# Patient Record
Sex: Male | Born: 1975 | Race: White | Hispanic: No | Marital: Single
Health system: Southern US, Community
[De-identification: ages and names within clinical notes are randomized; demographics above are authoritative.]

## PROBLEM LIST (undated history)

## (undated) DIAGNOSIS — Z9889 Other specified postprocedural states: Secondary | ICD-10-CM

---

## 2001-03-23 ENCOUNTER — Emergency Department (HOSPITAL_COMMUNITY): Admission: EM | Admit: 2001-03-23 | Discharge: 2001-03-23 | Payer: Self-pay | Admitting: Emergency Medicine

## 2009-09-01 ENCOUNTER — Emergency Department: Payer: Self-pay | Admitting: Emergency Medicine

## 2009-09-04 ENCOUNTER — Emergency Department: Payer: Self-pay | Admitting: Emergency Medicine

## 2011-09-20 ENCOUNTER — Emergency Department: Payer: Self-pay | Admitting: Unknown Physician Specialty

## 2013-07-05 ENCOUNTER — Emergency Department: Payer: Self-pay | Admitting: Emergency Medicine

## 2013-08-25 ENCOUNTER — Emergency Department: Payer: Self-pay | Admitting: Emergency Medicine

## 2014-08-25 ENCOUNTER — Emergency Department: Payer: Self-pay | Admitting: Emergency Medicine

## 2014-11-11 ENCOUNTER — Emergency Department: Payer: Self-pay | Admitting: Emergency Medicine

## 2015-12-18 ENCOUNTER — Other Ambulatory Visit: Payer: Self-pay | Admitting: Physical Medicine and Rehabilitation

## 2015-12-18 DIAGNOSIS — M5416 Radiculopathy, lumbar region: Secondary | ICD-10-CM

## 2015-12-27 ENCOUNTER — Ambulatory Visit: Admission: RE | Admit: 2015-12-27 | Payer: Medicaid Other | Source: Ambulatory Visit

## 2018-01-04 ENCOUNTER — Emergency Department
Admission: EM | Admit: 2018-01-04 | Discharge: 2018-01-04 | Disposition: A | Discharge status: Home / Self Care | Payer: Self-pay | Attending: Emergency Medicine | Admitting: Emergency Medicine

## 2018-01-04 ENCOUNTER — Encounter: Payer: Self-pay | Admitting: Emergency Medicine

## 2018-01-04 ENCOUNTER — Emergency Department: Payer: Self-pay

## 2018-01-04 DIAGNOSIS — W172XXA Fall into hole, initial encounter: Secondary | ICD-10-CM | POA: Insufficient documentation

## 2018-01-04 DIAGNOSIS — Y929 Unspecified place or not applicable: Secondary | ICD-10-CM | POA: Insufficient documentation

## 2018-01-04 DIAGNOSIS — Y999 Unspecified external cause status: Secondary | ICD-10-CM | POA: Insufficient documentation

## 2018-01-04 DIAGNOSIS — S83402A Sprain of unspecified collateral ligament of left knee, initial encounter: Secondary | ICD-10-CM | POA: Insufficient documentation

## 2018-01-04 DIAGNOSIS — Y939 Activity, unspecified: Secondary | ICD-10-CM | POA: Insufficient documentation

## 2018-01-04 MED ORDER — NAPROXEN 500 MG PO TABS: 500.0000 mg | ORAL_TABLET | Freq: Two times a day (BID) | ORAL | Status: DC

## 2018-01-04 MED ORDER — TRAMADOL HCL 50 MG PO TABS: 50.0000 mg | ORAL_TABLET | Freq: Four times a day (QID) | ORAL | 0 refills | Status: DC | PRN

## 2018-01-04 NOTE — ED Notes (Signed)
Patient ambulatory to lobby. Verbalized understanding of discharge instructions and follow-up care.

## 2018-01-04 NOTE — ED Triage Notes (Signed)
Pt reports "falling in a hole" four days ago and then states "my boot got caught and my knee twisted." Pt reports an outward rotation at time of injury. Pt states "i've just been trying to dodge this part and hope it gets better." No deformity or swelling visualized to left knee. Pt has been using OTC brace and medication.

## 2018-01-04 NOTE — ED Provider Notes (Signed)
[facility] Emergency Department Provider Note   ____________________________________________   First MD Initiated Contact with Patient 01/04/18 1555     (approximate)  I have reviewed the triage vital signs and the nursing notes.   HISTORY  Chief Complaint Knee Pain    HPI Scott Case is a 42 y.o. male patient complaining of left knee pain secondary to fall in a hole 4 days ago.  Patient rates the pain as 8/10.  Patient described the pain is "aching".  Patient is using over-the-counter elastic knee support.  Patient is able to bear weight with difficulty.  History reviewed. No pertinent past medical history.  There are no active problems to display for this patient.   History reviewed. No pertinent surgical history.  Prior to Admission medications   Medication Sig Start Date End Date Taking? Authorizing Provider  naproxen (NAPROSYN) 500 MG tablet Take 1 tablet (500 mg total) by mouth 2 (two) times daily with a meal. 01/04/18   Joni Reining, PA-C  traMADol (ULTRAM) 50 MG tablet Take 1 tablet (50 mg total) by mouth every 6 (six) hours as needed for moderate pain. 01/04/18   Joni Reining, PA-C    Allergies Patient has no allergy information on record.  No family history on file.  Social History Social History   Tobacco Use  . Smoking status: Former Games developer  . Smokeless tobacco: Never Used  Substance Use Topics  . Alcohol use: No    Frequency: Never  . Drug use: No    Review of Systems Constitutional: No fever/chills Eyes: No visual changes. ENT: No sore throat. Cardiovascular: Denies chest pain. Respiratory: Denies shortness of breath. Gastrointestinal: No abdominal pain.  No nausea, no vomiting.  No diarrhea.  No constipation. Genitourinary: Negative for dysuria. Musculoskeletal: Left knee pain. Skin: Negative for rash. Neurological: Negative for headaches, focal weakness or  numbness.   ____________________________________________   PHYSICAL EXAM:  VITAL SIGNS: ED Triage Vitals  Enc Vitals Group     BP 01/04/18 1521 (!) 113/40     Pulse Rate 01/04/18 1521 68     Resp 01/04/18 1521 18     Temp 01/04/18 1521 97.8 F (36.6 C)     Temp Source 01/04/18 1521 Oral     SpO2 01/04/18 1521 98 %     Weight 01/04/18 1524 185 lb (83.9 kg)     Height 01/04/18 1524 6\' 1"  (1.854 m)     Head Circumference --      Peak Flow --      Pain Score 01/04/18 1521 8     Pain Loc --      Pain Edu? --      Excl. in GC? --    Constitutional: Alert and oriented. Well appearing and in no acute distress. Cardiovascular: Normal rate, regular rhythm. Grossly normal heart sounds.  Good peripheral circulation. Respiratory: Normal respiratory effort.  No retractions. Lungs CTAB. Musculoskeletal: No obvious deformity or edema to the left knee.  Decreased range of motion with flexion limited by complaint of pain.  Positive patellar crepitus with palpation.  Positive guarding insertion point of the LCL. Neurologic:  Normal speech and language. No gross focal neurologic deficits are appreciated. No gait instability. Skin:  Skin is warm, dry and intact. No rash noted. Psychiatric: Mood and affect are normal. Speech and behavior are normal.  ____________________________________________   LABS (all labs ordered are listed, but only abnormal results are displayed)  Labs Reviewed - No data  to display ____________________________________________  EKG   ____________________________________________  RADIOLOGY  ED MD interpretation: No acute findings on x-ray of the left knee.  Official radiology report(s): Dg Knee Complete 4 Views Left  Result Date: 01/04/2018 CLINICAL DATA:  Pain to medial knee after twisting knee 4 days ago. EXAM: LEFT KNEE - COMPLETE 4+ VIEW COMPARISON:  None. FINDINGS: Minimal degenerative changes in the mediolateral compartment with no fracture, dislocation,  or joint effusion. IMPRESSION: Minimal degenerative changes. Electronically Signed   By: Gerome Samavid  Williams III M.D   On: 01/04/2018 16:15    ____________________________________________   PROCEDURES  Procedure(s) performed: None  Procedures  Critical Care performed: No  ____________________________________________   INITIAL IMPRESSION / ASSESSMENT AND PLAN / ED COURSE  As part of my medical decision making, I reviewed the following data within the electronic MEDICAL RECORD NUMBER   Left knee pain secondary to sprain.  Discussed negative x-ray findings with patient.  Patient advised to continue wearing over-the-counter elastic knee support.  Patient given discharge care instruction prescription for naproxen and tramadol.  Patient given a work note for 2 days.  Patient advised [facility].       ____________________________________________   FINAL CLINICAL IMPRESSION(S) / ED DIAGNOSES  Final diagnoses:  Sprain of collateral ligament of left knee, initial encounter     ED Discharge Orders        Ordered    naproxen (NAPROSYN) 500 MG tablet  2 times daily with meals     01/04/18 1622    traMADol (ULTRAM) 50 MG tablet  Every 6 hours PRN     01/04/18 1622       Note:  This document was prepared using Dragon voice recognition software and may include unintentional dictation errors.    Joni ReiningSmith, Litha Lamartina K, PA-C 01/04/18 1626    Joni ReiningSmith, Emberlin Verner K, PA-C 01/04/18 1627    Merrily Brittleifenbark, Neil, MD 01/04/18 [phone]

## 2019-03-26 IMAGING — CR DG KNEE COMPLETE 4+V*L*
1 series · 4 of 4 positions shown · non-contrast
Comparison: None.

CLINICAL DATA: Pain to medial knee after twisting knee 4 days ago.

EXAM:
LEFT KNEE - COMPLETE 4+ VIEW

[Series 1: dg knee complete 4 views left · 0.14mm/px · 4 of 4 slices shown]
[im 1/4]
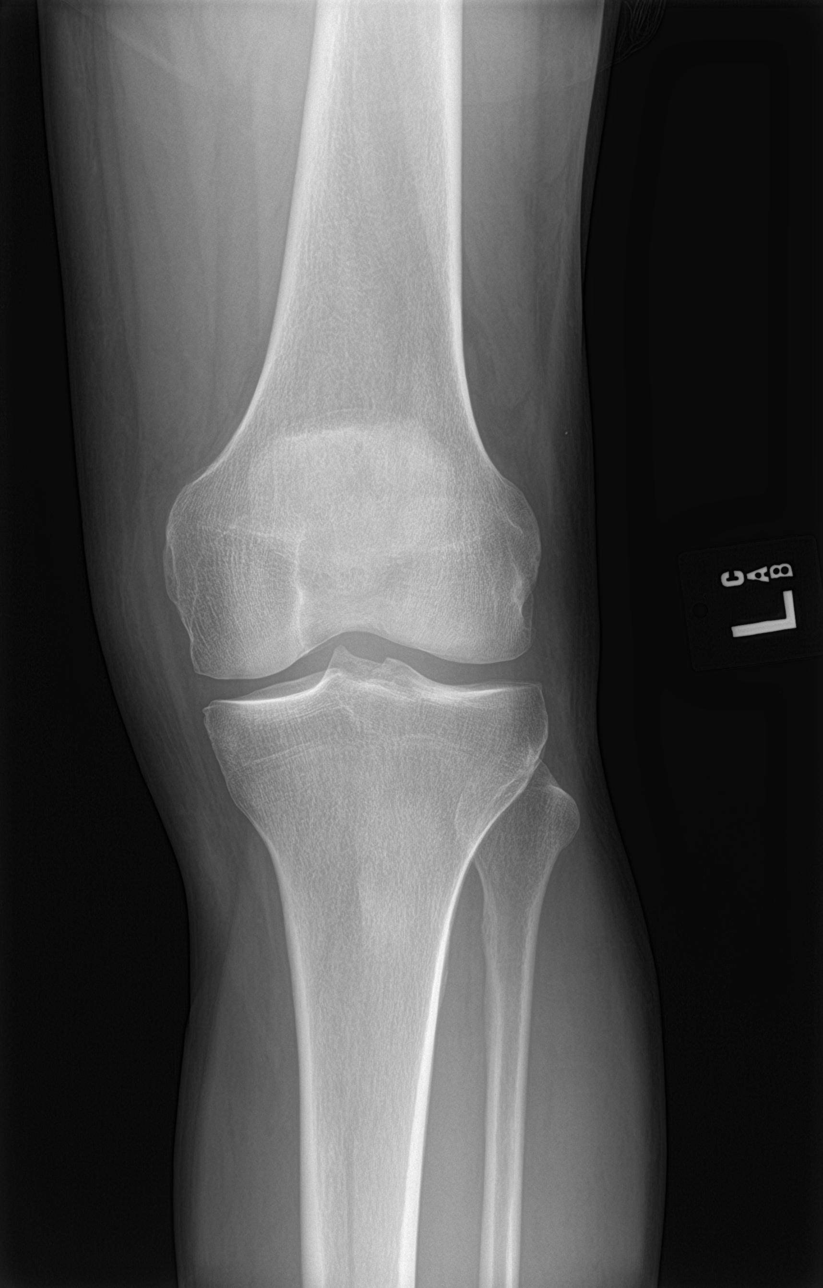
[im 2/4]
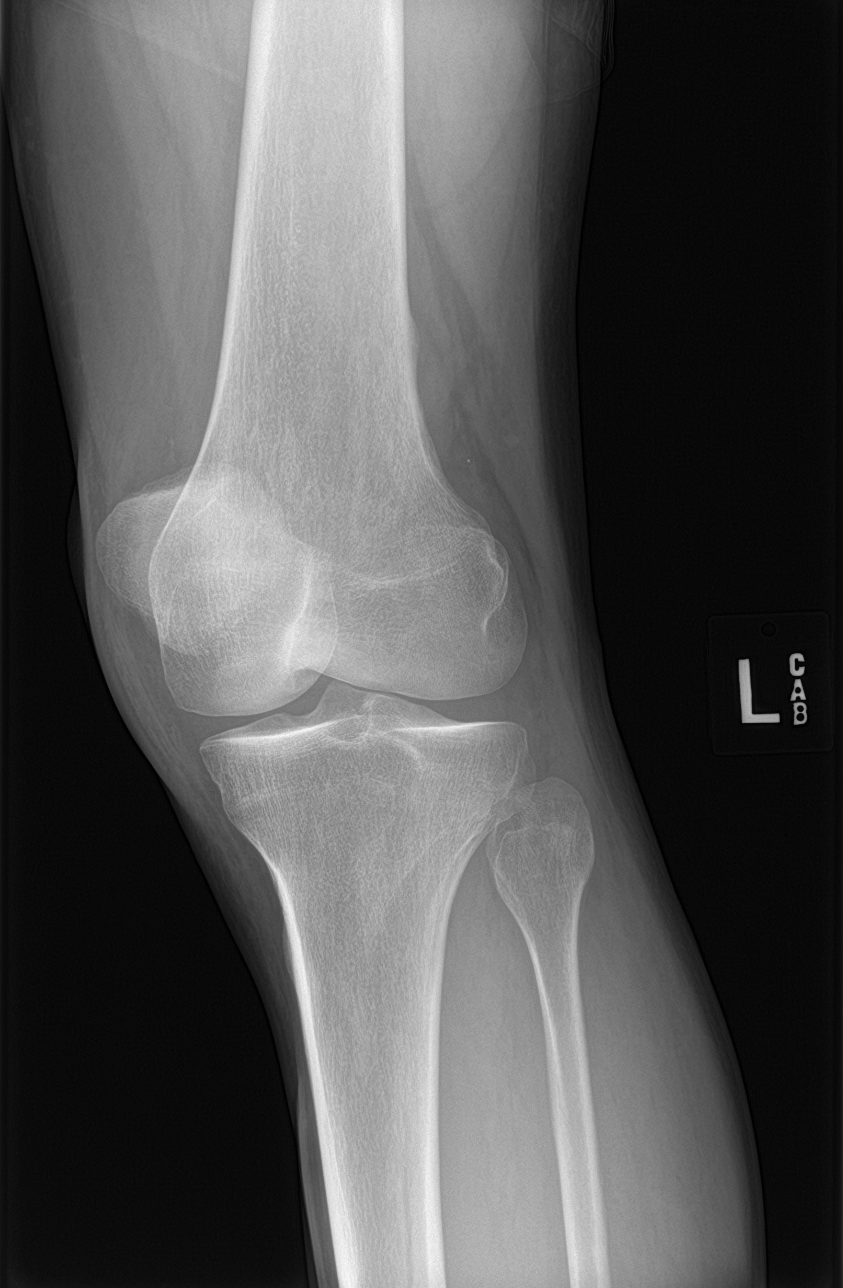
[im 3/4]
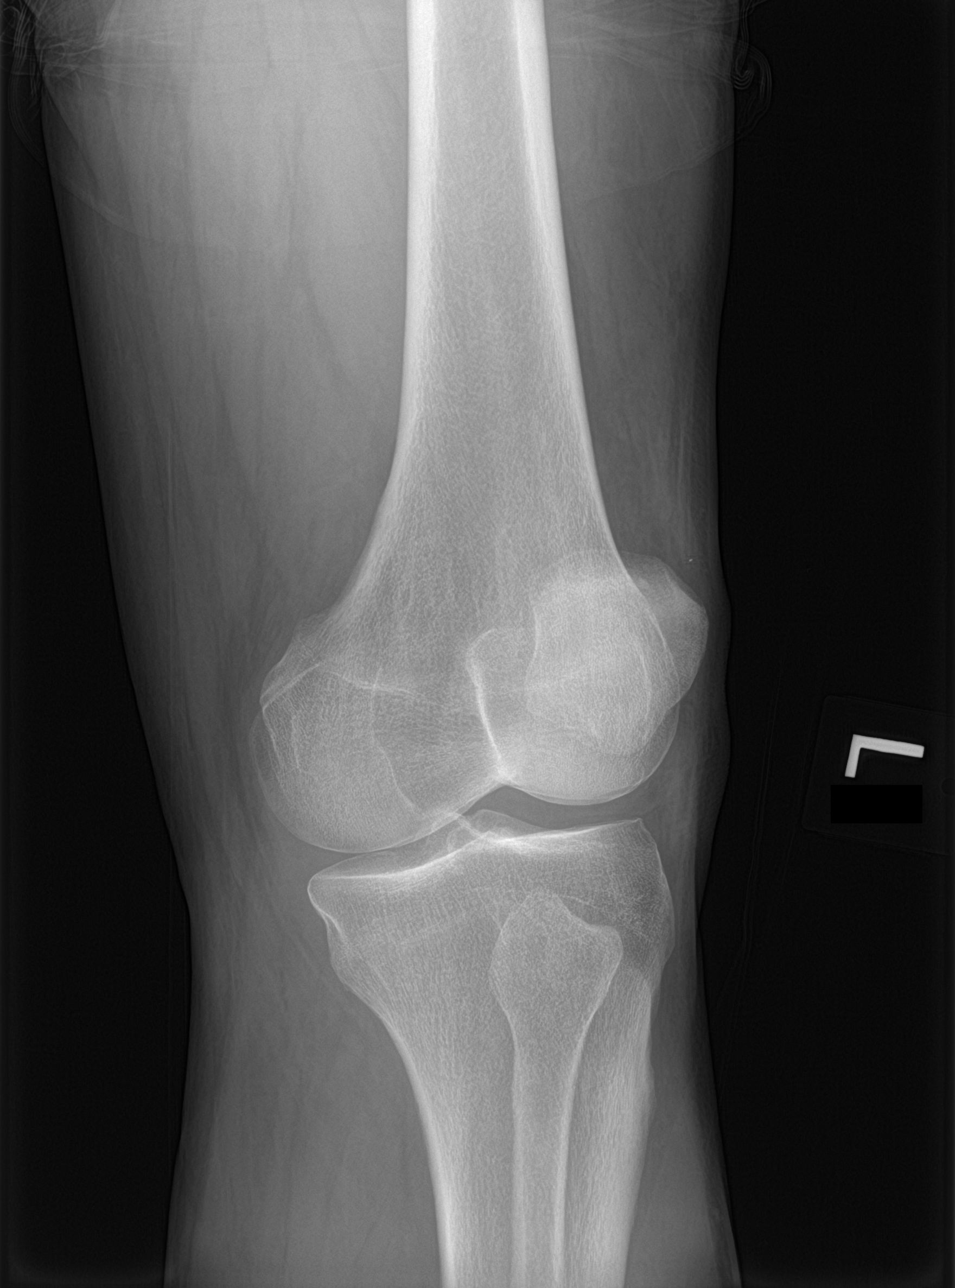
[im 4/4]
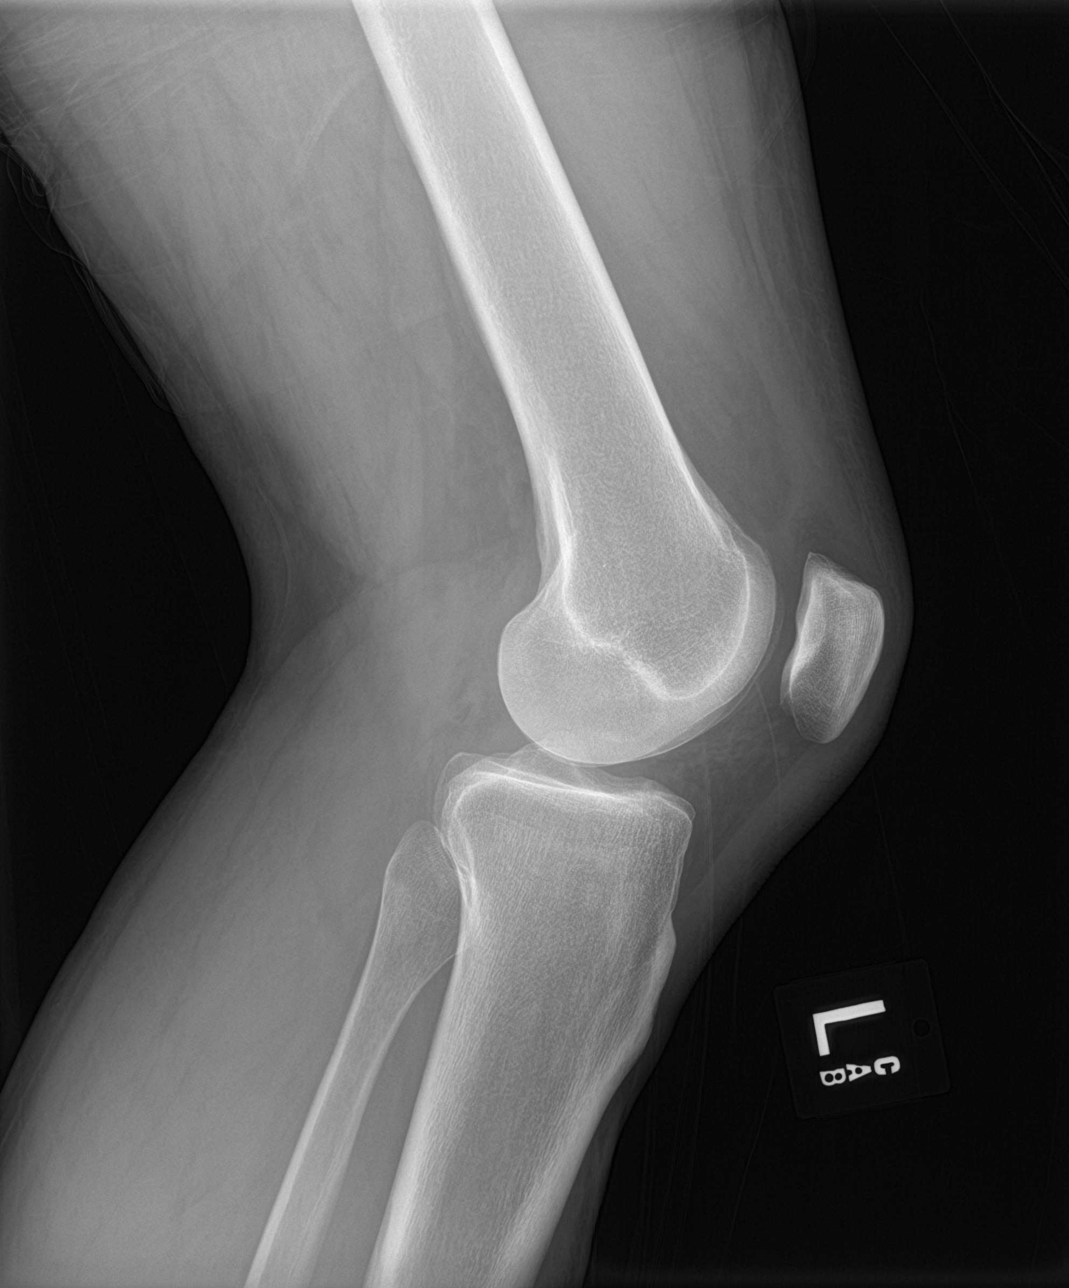

[4 of 4 positions shown; findings below may reference images not displayed]

FINDINGS: Minimal degenerative changes in the mediolateral compartment with no
fracture, dislocation, or joint effusion.
IMPRESSION: Minimal degenerative changes.

## 2020-08-12 ENCOUNTER — Emergency Department: Payer: Medicaid Other

## 2020-08-12 ENCOUNTER — Other Ambulatory Visit: Payer: Self-pay

## 2020-08-12 DIAGNOSIS — S61216A Laceration without foreign body of right little finger without damage to nail, initial encounter: Secondary | ICD-10-CM | POA: Insufficient documentation

## 2020-08-12 DIAGNOSIS — X58XXXA Exposure to other specified factors, initial encounter: Secondary | ICD-10-CM | POA: Insufficient documentation

## 2020-08-12 DIAGNOSIS — Z5321 Procedure and treatment not carried out due to patient leaving prior to being seen by health care provider: Secondary | ICD-10-CM | POA: Insufficient documentation

## 2020-08-12 MED ORDER — OXYCODONE HCL 5 MG PO TABS
5.0000 mg | ORAL_TABLET | Freq: Once | ORAL | Status: AC
  Administered 2020-08-12: 5 mg via ORAL
  Filled 2020-08-12: qty 1

## 2020-08-12 MED ORDER — ACETAMINOPHEN 500 MG PO TABS
1000.0000 mg | ORAL_TABLET | Freq: Once | ORAL | Status: AC
  Administered 2020-08-12: 1000 mg via ORAL
  Filled 2020-08-12: qty 2

## 2020-08-12 NOTE — ED Triage Notes (Signed)
Pt presents via POV c/o right pinky injury. Pt with extensive laceration of right pinky finger. Ambulatory to triage.

## 2020-08-13 ENCOUNTER — Emergency Department
Admission: EM | Admit: 2020-08-13 | Discharge: 2020-08-13 | Disposition: A | Discharge status: Home / Self Care | Payer: Medicaid Other | Attending: Emergency Medicine | Admitting: Emergency Medicine

## 2021-08-11 ENCOUNTER — Emergency Department (HOSPITAL_COMMUNITY): Payer: Self-pay

## 2021-08-11 ENCOUNTER — Encounter (HOSPITAL_COMMUNITY): Payer: Self-pay | Admitting: *Deleted

## 2021-08-11 ENCOUNTER — Inpatient Hospital Stay (HOSPITAL_COMMUNITY)
Admission: EM | Admit: 2021-08-11 | Discharge: 2021-08-15 | DRG: 200 | Disposition: A | Discharge status: Home / Self Care | Payer: Self-pay | Attending: Surgery | Admitting: Surgery

## 2021-08-11 ENCOUNTER — Observation Stay (HOSPITAL_COMMUNITY): Payer: Self-pay

## 2021-08-11 DIAGNOSIS — J939 Pneumothorax, unspecified: Secondary | ICD-10-CM

## 2021-08-11 DIAGNOSIS — J9690 Respiratory failure, unspecified, unspecified whether with hypoxia or hypercapnia: Secondary | ICD-10-CM

## 2021-08-11 DIAGNOSIS — Z9689 Presence of other specified functional implants: Secondary | ICD-10-CM

## 2021-08-11 DIAGNOSIS — S270XXA Traumatic pneumothorax, initial encounter: Secondary | ICD-10-CM

## 2021-08-11 DIAGNOSIS — Z4682 Encounter for fitting and adjustment of non-vascular catheter: Secondary | ICD-10-CM

## 2021-08-11 DIAGNOSIS — T1490XA Injury, unspecified, initial encounter: Principal | ICD-10-CM

## 2021-08-11 DIAGNOSIS — S2249XA Multiple fractures of ribs, unspecified side, initial encounter for closed fracture: Secondary | ICD-10-CM | POA: Diagnosis present

## 2021-08-11 DIAGNOSIS — T797XXA Traumatic subcutaneous emphysema, initial encounter: Secondary | ICD-10-CM | POA: Diagnosis present

## 2021-08-11 DIAGNOSIS — Z20822 Contact with and (suspected) exposure to covid-19: Secondary | ICD-10-CM | POA: Diagnosis present

## 2021-08-11 DIAGNOSIS — S2241XA Multiple fractures of ribs, right side, initial encounter for closed fracture: Secondary | ICD-10-CM | POA: Diagnosis present

## 2021-08-11 DIAGNOSIS — R030 Elevated blood-pressure reading, without diagnosis of hypertension: Secondary | ICD-10-CM | POA: Diagnosis present

## 2021-08-11 HISTORY — DX: Other specified postprocedural states: Z98.890

## 2021-08-11 LAB — COMPREHENSIVE METABOLIC PANEL
ALT: 43 U/L (ref 0–44)
AST: 42 U/L — ABNORMAL HIGH (ref 15–41)
Albumin: 4.2 g/dL (ref 3.5–5.0)
Alkaline Phosphatase: 63 U/L (ref 38–126)
Anion gap: 10 (ref 5–15)
BUN: 13 mg/dL (ref 6–20)
CO2: 25 mmol/L (ref 22–32)
Calcium: 9 mg/dL (ref 8.9–10.3)
Chloride: 101 mmol/L (ref 98–111)
Creatinine, Ser: 1.25 mg/dL — ABNORMAL HIGH (ref 0.61–1.24)
GFR, Estimated: >60 mL/min (ref >60)
Glucose, Bld: 111 mg/dL — ABNORMAL HIGH (ref 70–99)
Potassium: 3.5 mmol/L (ref 3.5–5.1)
Sodium: 136 mmol/L (ref 135–145)
Total Bilirubin: 1.5 mg/dL — ABNORMAL HIGH (ref 0.3–1.2)
Total Protein: 6.7 g/dL (ref 6.5–8.1)

## 2021-08-11 LAB — I-STAT ARTERIAL BLOOD GAS, ED
Acid-base deficit: 3 mmol/L — ABNORMAL HIGH (ref 0.0–2.0)
Bicarbonate: 21.8 mmol/L (ref 20.0–28.0)
Calcium, Ion: 1.19 mmol/L (ref 1.15–1.40)
HCT: 42 % (ref 39.0–52.0)
Hemoglobin: 14.3 g/dL (ref 13.0–17.0)
O2 Saturation: 98 %
Patient temperature: 97.7
Potassium: 3.8 mmol/L (ref 3.5–5.1)
Sodium: 137 mmol/L (ref 135–145)
TCO2: 23 mmol/L (ref 22–32)
pCO2 arterial: 35.7 mmHg (ref 32.0–48.0)
pH, Arterial: 7.393 (ref 7.350–7.450)
pO2, Arterial: 108 mmHg (ref 83.0–108.0)

## 2021-08-11 LAB — CBC
HCT: 46.5 % (ref 39.0–52.0)
Hemoglobin: 16.4 g/dL (ref 13.0–17.0)
MCH: 31.4 pg (ref 26.0–34.0)
MCHC: 35.3 g/dL (ref 30.0–36.0)
MCV: 88.9 fL (ref 80.0–100.0)
Platelets: 274 10*3/uL (ref 150–400)
RBC: 5.23 MIL/uL (ref 4.22–5.81)
RDW: 11.8 % (ref 11.5–15.5)
WBC: 9.4 10*3/uL (ref 4.0–10.5)
nRBC: 0 % (ref 0.0–0.2)

## 2021-08-11 LAB — PROTIME-INR
INR: 1 (ref 0.8–1.2)
Prothrombin Time: 12.7 seconds (ref 11.4–15.2)

## 2021-08-11 LAB — URINALYSIS, ROUTINE W REFLEX MICROSCOPIC
Bacteria, UA: NONE SEEN
Bilirubin Urine: NEGATIVE
Glucose, UA: NEGATIVE mg/dL
Ketones, ur: NEGATIVE mg/dL
Leukocytes,Ua: NEGATIVE
Nitrite: NEGATIVE
Protein, ur: NEGATIVE mg/dL
Specific Gravity, Urine: >1.046 — ABNORMAL HIGH (ref 1.005–1.030)
pH: 5 (ref 5.0–8.0)

## 2021-08-11 LAB — SAMPLE TO BLOOD BANK

## 2021-08-11 LAB — I-STAT CHEM 8, ED
BUN: 14 mg/dL (ref 6–20)
Calcium, Ion: 1.06 mmol/L — ABNORMAL LOW (ref 1.15–1.40)
Chloride: 102 mmol/L (ref 98–111)
Creatinine, Ser: 1.5 mg/dL — ABNORMAL HIGH (ref 0.61–1.24)
Glucose, Bld: 106 mg/dL — ABNORMAL HIGH (ref 70–99)
HCT: 46 % (ref 39.0–52.0)
Hemoglobin: 15.6 g/dL (ref 13.0–17.0)
Potassium: 3.5 mmol/L (ref 3.5–5.1)
Sodium: 139 mmol/L (ref 135–145)
TCO2: 25 mmol/L (ref 22–32)

## 2021-08-11 LAB — LACTIC ACID, PLASMA: Lactic Acid, Venous: 1.7 mmol/L (ref 0.5–1.9)

## 2021-08-11 LAB — ETHANOL: Alcohol, Ethyl (B): 141 mg/dL — ABNORMAL HIGH (ref <10)

## 2021-08-11 MED ORDER — DOCUSATE SODIUM 100 MG PO CAPS
100.0000 mg | ORAL_CAPSULE | Freq: Two times a day (BID) | ORAL | Status: DC
  Administered 2021-08-12 – 2021-08-15 (×7): 100 mg via ORAL
  Filled 2021-08-11 (×7): qty 1

## 2021-08-11 MED ORDER — LACTATED RINGERS IV BOLUS
1000.0000 mL | Freq: Once | INTRAVENOUS | Status: AC
  Administered 2021-08-11: 1000 mL via INTRAVENOUS

## 2021-08-11 MED ORDER — OXYCODONE HCL 5 MG PO TABS
5.0000 mg | ORAL_TABLET | ORAL | Status: DC | PRN
  Administered 2021-08-12 (×2): 10 mg via ORAL
  Administered 2021-08-12: 5 mg via ORAL
  Administered 2021-08-12 – 2021-08-13 (×3): 10 mg via ORAL
  Filled 2021-08-11 (×6): qty 2

## 2021-08-11 MED ORDER — LIDOCAINE-EPINEPHRINE (PF) 2 %-1:200000 IJ SOLN: 10.0000 mL | Freq: Once | INTRAMUSCULAR | Status: DC

## 2021-08-11 MED ORDER — HYDROMORPHONE HCL 1 MG/ML IJ SOLN
1.0000 mg | Freq: Once | INTRAMUSCULAR | Status: AC
  Administered 2021-08-11: 1 mg via INTRAVENOUS
  Filled 2021-08-11: qty 1

## 2021-08-11 MED ORDER — ENOXAPARIN SODIUM 30 MG/0.3ML IJ SOSY
30.0000 mg | PREFILLED_SYRINGE | Freq: Two times a day (BID) | INTRAMUSCULAR | Status: DC
  Administered 2021-08-12 – 2021-08-15 (×7): 30 mg via SUBCUTANEOUS
  Filled 2021-08-11 (×7): qty 0.3

## 2021-08-11 MED ORDER — IOHEXOL 300 MG/ML  SOLN
100.0000 mL | Freq: Once | INTRAMUSCULAR | Status: AC | PRN
  Administered 2021-08-11: 100 mL via INTRAVENOUS

## 2021-08-11 MED ORDER — ONDANSETRON HCL 4 MG/2ML IJ SOLN: 4.0000 mg | Freq: Four times a day (QID) | INTRAMUSCULAR | Status: DC | PRN

## 2021-08-11 MED ORDER — IBUPROFEN 200 MG PO TABS
400.0000 mg | ORAL_TABLET | Freq: Four times a day (QID) | ORAL | Status: DC
  Administered 2021-08-12 – 2021-08-15 (×13): 400 mg via ORAL
  Filled 2021-08-11 (×13): qty 2

## 2021-08-11 MED ORDER — MORPHINE SULFATE (PF) 4 MG/ML IV SOLN
4.0000 mg | INTRAVENOUS | Status: DC | PRN
  Administered 2021-08-11: 4 mg via INTRAVENOUS
  Filled 2021-08-11: qty 1

## 2021-08-11 MED ORDER — ONDANSETRON 4 MG PO TBDP: 4.0000 mg | ORAL_TABLET | Freq: Four times a day (QID) | ORAL | Status: DC | PRN

## 2021-08-11 MED ORDER — HYDROMORPHONE HCL 1 MG/ML IJ SOLN
1.0000 mg | Freq: Once | INTRAMUSCULAR | Status: DC
  Filled 2021-08-11 (×2): qty 1

## 2021-08-11 MED ORDER — LIDOCAINE 5 % EX PTCH
3.0000 | MEDICATED_PATCH | CUTANEOUS | Status: DC
  Administered 2021-08-12 – 2021-08-14 (×4): 3 via TRANSDERMAL
  Filled 2021-08-11 (×4): qty 3

## 2021-08-11 MED ORDER — HYDROMORPHONE HCL 1 MG/ML IJ SOLN: 1.0000 mg | Freq: Once | INTRAMUSCULAR | Status: AC

## 2021-08-11 MED ORDER — METHOCARBAMOL 500 MG PO TABS
1000.0000 mg | ORAL_TABLET | Freq: Three times a day (TID) | ORAL | Status: DC
  Administered 2021-08-12 – 2021-08-15 (×12): 1000 mg via ORAL
  Filled 2021-08-11 (×12): qty 2

## 2021-08-11 MED ORDER — ACETAMINOPHEN 500 MG PO TABS
1000.0000 mg | ORAL_TABLET | Freq: Four times a day (QID) | ORAL | Status: DC
  Administered 2021-08-12 – 2021-08-15 (×14): 1000 mg via ORAL
  Filled 2021-08-11 (×13): qty 2

## 2021-08-11 MED ORDER — HYDROMORPHONE HCL 1 MG/ML IJ SOLN
INTRAMUSCULAR | Status: AC
  Administered 2021-08-11: 1 mg via INTRAVENOUS
  Filled 2021-08-11: qty 1

## 2021-08-11 MED ORDER — HYDROMORPHONE HCL 1 MG/ML IJ SOLN
1.0000 mg | INTRAMUSCULAR | Status: DC | PRN
  Administered 2021-08-12: 1 mg via INTRAVENOUS
  Administered 2021-08-12: 2 mg via INTRAVENOUS
  Administered 2021-08-13 (×2): 1 mg via INTRAVENOUS
  Filled 2021-08-11 (×3): qty 1
  Filled 2021-08-11: qty 2

## 2021-08-11 MED ORDER — HYDROMORPHONE HCL 1 MG/ML IJ SOLN
INTRAMUSCULAR | Status: AC
  Filled 2021-08-11: qty 1

## 2021-08-11 MED ORDER — LACTATED RINGERS IV SOLN: INTRAVENOUS | Status: DC

## 2021-08-11 MED ORDER — LIDOCAINE-EPINEPHRINE (PF) 2 %-1:200000 IJ SOLN
50.0000 mL | Freq: Once | INTRAMUSCULAR | Status: AC
  Administered 2021-08-11: 50 mL

## 2021-08-11 NOTE — Procedures (Signed)
   Procedure Note  Date: 08/11/2021  Procedure: tube thoracostomy--right    Pre-op diagnosis: right pneumothorax  Post-op diagnosis: same  Surgeon: Diamantina Monks, MD  Anesthesia: local   EBL: <5cc procedural; 0cc     evacuated Drains/Implants: 72F chest tube Specimen: none  Description of procedure: Time-out was performed verifying correct patient, procedure, site, laterality, and signature of informed consent. Thirty cc's of local anesthetic was infiltrated into the tissues just over the fourth intercostal space.  A longitudinal incision was made parallel to the rib at the fourth intercostal space. This incision was deepened down through the muscle until the pleural cavity was entered. An audible air rush was encountered upon entry and a 72F chest tube was inserted through this tract.   The tube was secured at the skin with suture and connected to an atrium at -20cm water wall suction. Immediate output from the chest tube was 0cc. The site was dressed with gauze and tape. The patient tolerated the procedure well. There were no complications. Follow up chest x-ray was ordered to confirm tube positioning.    Diamantina Monks, MD General and Trauma Surgery [facility]

## 2021-08-11 NOTE — ED Notes (Signed)
Wife came out to nursing station reporting swelling to pts face. Pt had notable swelling to neck, face, and L eye. Pt reports SOB is the same as previous and having increased pain. Dr.Lockwood and Dr.Hayes at bedside
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
## 2021-08-11 NOTE — ED Notes (Signed)
RT at bedside for ABG

## 2021-08-11 NOTE — ED Notes (Signed)
Dr Lovick at bedside ?
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
## 2021-08-11 NOTE — ED Notes (Signed)
Dr.Lovick at bedside for R sided chest tube placement
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
## 2021-08-11 NOTE — Progress Notes (Signed)
Orthopedic Tech Progress Note Patient Details:  Scott Case January 16, 1976 [id number]  Patient ID: [name], male   DOB: Oct 08, 1976, 45 y.o.   MRN: [id number] Arrived at trauma page. [name] 08/11/2021, 11:14 PM

## 2021-08-11 NOTE — Progress Notes (Signed)
Met with patient at bedside - no family present and no distress exhibited.   08/11/21 2003  Clinical Encounter Type  Visit Type Initial  Referral From Nurse  Consult/Referral To Chaplain  Stress Factors  Patient Stress Factors None identified  Family Stress Factors None identified  Advance Directives (For Healthcare)  Does Patient Have a Medical Advance Directive? No  Mental Health Advance Directives  Does Patient Have a Mental Health Advance Directive? No

## 2021-08-11 NOTE — ED Notes (Signed)
Pt returned from CT via stretcher, pt encouraged to take slow breaths, pt grunting d/t pain in R ribs

## 2021-08-11 NOTE — ED Notes (Signed)
Increased swelling noted around L eye and mild swelling noted to R eye

## 2021-08-12 ENCOUNTER — Other Ambulatory Visit: Payer: Self-pay

## 2021-08-12 ENCOUNTER — Encounter (HOSPITAL_COMMUNITY): Payer: Self-pay

## 2021-08-12 ENCOUNTER — Inpatient Hospital Stay (HOSPITAL_COMMUNITY): Payer: Self-pay

## 2021-08-12 LAB — BLOOD GAS, ARTERIAL
Acid-Base Excess: 0.1 mmol/L (ref 0.0–2.0)
Bicarbonate: 24.2 mmol/L (ref 20.0–28.0)
Drawn by: 28340
FIO2: 28
O2 Saturation: 97.8 %
Patient temperature: 37
pCO2 arterial: 38.9 mmHg (ref 32.0–48.0)
pH, Arterial: 7.411 (ref 7.350–7.450)
pO2, Arterial: 96.2 mmHg (ref 83.0–108.0)

## 2021-08-12 LAB — HIV ANTIBODY (ROUTINE TESTING W REFLEX): HIV Screen 4th Generation wRfx: NONREACTIVE

## 2021-08-12 LAB — CBC
HCT: 42.2 % (ref 39.0–52.0)
Hemoglobin: 15.1 g/dL (ref 13.0–17.0)
MCH: 31.7 pg (ref 26.0–34.0)
MCHC: 35.8 g/dL (ref 30.0–36.0)
MCV: 88.5 fL (ref 80.0–100.0)
Platelets: 204 10*3/uL (ref 150–400)
RBC: 4.77 MIL/uL (ref 4.22–5.81)
RDW: 11.9 % (ref 11.5–15.5)
WBC: 9.7 10*3/uL (ref 4.0–10.5)
nRBC: 0 % (ref 0.0–0.2)

## 2021-08-12 LAB — BASIC METABOLIC PANEL
Anion gap: 10 (ref 5–15)
BUN: 10 mg/dL (ref 6–20)
CO2: 25 mmol/L (ref 22–32)
Calcium: 8.9 mg/dL (ref 8.9–10.3)
Chloride: 99 mmol/L (ref 98–111)
Creatinine, Ser: 1.01 mg/dL (ref 0.61–1.24)
GFR, Estimated: >60 mL/min (ref >60)
Glucose, Bld: 109 mg/dL — ABNORMAL HIGH (ref 70–99)
Potassium: 3.7 mmol/L (ref 3.5–5.1)
Sodium: 134 mmol/L — ABNORMAL LOW (ref 135–145)
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
## 2021-08-12 NOTE — Plan of Care (Signed)
Care plan reviewed with pt.
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
## 2021-08-12 NOTE — Progress Notes (Signed)
ABG collected and sent to lab 

## 2021-08-13 ENCOUNTER — Inpatient Hospital Stay (HOSPITAL_COMMUNITY): Payer: Self-pay

## 2021-08-13 LAB — CBC
HCT: 41.5 % (ref 39.0–52.0)
Hemoglobin: 14.6 g/dL (ref 13.0–17.0)
MCH: 31.3 pg (ref 26.0–34.0)
MCHC: 35.2 g/dL (ref 30.0–36.0)
MCV: 88.9 fL (ref 80.0–100.0)
Platelets: 186 10*3/uL (ref 150–400)
RBC: 4.67 MIL/uL (ref 4.22–5.81)
RDW: 12 % (ref 11.5–15.5)
WBC: 6.5 10*3/uL (ref 4.0–10.5)
nRBC: 0 % (ref 0.0–0.2)

## 2021-08-13 LAB — BASIC METABOLIC PANEL
Anion gap: 6 (ref 5–15)
BUN: 10 mg/dL (ref 6–20)
CO2: 28 mmol/L (ref 22–32)
Calcium: 8.5 mg/dL — ABNORMAL LOW (ref 8.9–10.3)
Chloride: 99 mmol/L (ref 98–111)
Creatinine, Ser: 0.92 mg/dL (ref 0.61–1.24)
GFR, Estimated: >60 mL/min (ref >60)
Glucose, Bld: 109 mg/dL — ABNORMAL HIGH (ref 70–99)
Potassium: 3.4 mmol/L — ABNORMAL LOW (ref 3.5–5.1)
Sodium: 133 mmol/L — ABNORMAL LOW (ref 135–145)

## 2021-08-13 MED ORDER — AMLODIPINE BESYLATE 5 MG PO TABS
5.0000 mg | ORAL_TABLET | Freq: Every day | ORAL | Status: DC
  Administered 2021-08-13 – 2021-08-15 (×3): 5 mg via ORAL
  Filled 2021-08-13 (×3): qty 1

## 2021-08-13 MED ORDER — HYDROMORPHONE HCL 1 MG/ML IJ SOLN
0.5000 mg | Freq: Four times a day (QID) | INTRAMUSCULAR | Status: DC | PRN
  Administered 2021-08-13 – 2021-08-14 (×2): 0.5 mg via INTRAVENOUS
  Filled 2021-08-13 (×2): qty 0.5

## 2021-08-13 MED ORDER — HYDRALAZINE HCL 20 MG/ML IJ SOLN: 5.0000 mg | Freq: Four times a day (QID) | INTRAMUSCULAR | Status: DC | PRN

## 2021-08-13 MED ORDER — POTASSIUM CHLORIDE CRYS ER 20 MEQ PO TBCR
40.0000 meq | EXTENDED_RELEASE_TABLET | Freq: Two times a day (BID) | ORAL | Status: AC
  Administered 2021-08-13 (×2): 40 meq via ORAL
  Filled 2021-08-13 (×2): qty 2

## 2021-08-13 MED ORDER — OXYCODONE HCL 5 MG PO TABS
10.0000 mg | ORAL_TABLET | ORAL | Status: DC | PRN
  Administered 2021-08-13: 15 mg via ORAL
  Administered 2021-08-13 – 2021-08-14 (×2): 10 mg via ORAL
  Administered 2021-08-14: 15 mg via ORAL
  Administered 2021-08-14: 10 mg via ORAL
  Administered 2021-08-14: 15 mg via ORAL
  Administered 2021-08-15 (×3): 10 mg via ORAL
  Filled 2021-08-13: qty 3
  Filled 2021-08-13 (×2): qty 2
  Filled 2021-08-13: qty 3
  Filled 2021-08-13 (×3): qty 2
  Filled 2021-08-13: qty 3
  Filled 2021-08-13: qty 2

## 2021-08-13 MED ORDER — POLYETHYLENE GLYCOL 3350 17 G PO PACK
17.0000 g | PACK | Freq: Every day | ORAL | Status: DC
  Administered 2021-08-13 – 2021-08-15 (×3): 17 g via ORAL
  Filled 2021-08-13 (×3): qty 1
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
## 2021-08-14 ENCOUNTER — Inpatient Hospital Stay (HOSPITAL_COMMUNITY): Payer: Self-pay

## 2021-08-14 LAB — CBC
HCT: 42.7 % (ref 39.0–52.0)
Hemoglobin: 15.1 g/dL (ref 13.0–17.0)
MCH: 31.1 pg (ref 26.0–34.0)
MCHC: 35.4 g/dL (ref 30.0–36.0)
MCV: 88 fL (ref 80.0–100.0)
Platelets: 203 10*3/uL (ref 150–400)
RBC: 4.85 MIL/uL (ref 4.22–5.81)
RDW: 11.8 % (ref 11.5–15.5)
WBC: 6.4 10*3/uL (ref 4.0–10.5)
nRBC: 0 % (ref 0.0–0.2)

## 2021-08-14 LAB — BASIC METABOLIC PANEL
Anion gap: 7 (ref 5–15)
BUN: 11 mg/dL (ref 6–20)
CO2: 25 mmol/L (ref 22–32)
Calcium: 8.6 mg/dL — ABNORMAL LOW (ref 8.9–10.3)
Chloride: 101 mmol/L (ref 98–111)
Creatinine, Ser: 0.86 mg/dL (ref 0.61–1.24)
GFR, Estimated: >60 mL/min (ref >60)
Glucose, Bld: 92 mg/dL (ref 70–99)
Potassium: 4 mmol/L (ref 3.5–5.1)
Sodium: 133 mmol/L — ABNORMAL LOW (ref 135–145)

## 2021-08-14 MED ORDER — TRAMADOL HCL 50 MG PO TABS
50.0000 mg | ORAL_TABLET | Freq: Four times a day (QID) | ORAL | Status: DC
  Administered 2021-08-14 – 2021-08-15 (×5): 50 mg via ORAL
  Filled 2021-08-14 (×5): qty 1
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
## 2021-08-14 NOTE — Discharge Instructions (Signed)
PNEUMOTHORAX OR HEMOTHORAX +/- RIB FRACTURES  HOME INSTRUCTIONS   PAIN CONTROL:  Pain is best controlled by a usual combination of three different methods TOGETHER:  Ice/Heat Over the counter pain medication Prescription pain medication You may experience some swelling and bruising in area of broken ribs. Ice packs or heating pads (30-60 minutes up to 6 times a day) will help. Use ice for the first few days to help decrease swelling and bruising, then switch to heat to help relax tight/sore spots and speed recovery. Some people prefer to use ice alone, heat alone, alternating between ice & heat. Experiment to what works for you. Swelling and bruising can take several weeks to resolve.  It is helpful to take an over-the-counter pain medication regularly for the first few weeks. Choose one of the following that works best for you:  Naproxen (Aleve, etc) Two 220mg tabs twice a day Ibuprofen (Advil, etc) Three 200mg tabs four times a day (every meal & bedtime) Acetaminophen (Tylenol, etc) 500-650mg four times a day (every meal & bedtime) A prescription for pain medication (such as oxycodone, hydrocodone, etc) may be given to you upon discharge. Take your pain medication as prescribed.  If you are having problems/concerns with the prescription medicine (does not control pain, nausea, vomiting, rash, itching, etc), please call us [phone] to see if we need to switch you to a different pain medicine that will work better for you and/or control your side effect better. If you need a refill on your pain medication, please contact your pharmacy. They will contact our office to request authorization. Prescriptions will not be filled after 5 pm or on week-ends. Avoid getting constipated. When taking pain medications, it is common to experience some constipation. Increasing fluid intake and taking a fiber supplement (such as Metamucil, Citrucel, FiberCon, MiraLax, etc) 1-2 times a day regularly will  usually help prevent this problem from occurring. A mild laxative (prune juice, Milk of Magnesia, MiraLax, etc) should be taken according to package directions if there are no bowel movements after 48 hours.  Watch out for diarrhea. If you have many loose bowel movements, simplify your diet to bland foods & liquids for a few days. Stop any stool softeners and decrease your fiber supplement. Switching to mild anti-diarrheal medications (Kayopectate, Pepto Bismol) can help. If this worsens or does not improve, please call us. Chest tube site wound: you may remove the dressing from your chest tube site 3 days after the removal of your chest tube. DO NOT shower over the dressing. Once   removed, you may shower as normal. Do not submerge your wound in water for 2-3 weeks.  FOLLOW UP  Please call our office to set up or confirm an appointment for follow up for 2 weeks after discharge. You will need to get a chest xray at either [facility] or [facility]. This will be outlined in your follow up instructions. Please call [facility] at [phone] if you have any questions about follow up.  If you have any orthopedic or other injuries you will need to follow up as outlined in your follow up instructions.   WHEN TO CALL US [phone]:  Poor pain control Reactions / problems with new medications (rash/itching, nausea, etc)  Fever over 101.5 F (38.5 C) Worsening swelling or bruising Redness, drainage, pain or swelling around chest tube site Worsening pain, productive cough, difficulty breathing or any other concerning symptoms  The clinic staff is available to answer your questions during   regular business hours (8:30am-5pm). Please don't hesitate to call and ask to speak to one of our nurses for clinical concerns.  If you have a medical emergency, go to the nearest emergency room or call 911.  A surgeon from [facility] is always on call at the hospitals   [facility]   [address], [address], , [place] [address] ?  MAIN: [phone] ? TOLL FREE: [phone] ?  FAX [phone]  [email or web address]      Information on Rib Fractures  A rib fracture is a break or crack in one of the bones of the ribs. The ribs are long, curved bones that wrap around your chest and attach to your spine and your breastbone. The ribs protect your heart, lungs, and other organs in the chest. A broken or cracked rib is often painful but is not usually serious. Most rib fractures heal on their own over time. However, rib fractures can be more serious if multiple ribs are broken or if broken ribs move out of place and push against other structures or organs. What are the causes? This condition is caused by: Repetitive movements with high force, such as pitching a baseball or having severe coughing spells. A direct blow to the chest, such as a sports injury, a car accident, or a fall. Cancer that has spread to the bones, which can weaken bones and cause them to break. What are the signs or symptoms? Symptoms of this condition include: Pain when you breathe in or cough. Pain when someone presses on the injured area. Feeling short of breath. How is this diagnosed? This condition is diagnosed with a physical exam and medical history. Imaging tests may also be done, such as: Chest X-ray. CT scan. MRI. Bone scan. Chest ultrasound. How is this treated? Treatment for this condition depends on the severity of the fracture. Most rib fractures usually heal on their own in 1-3 months. Sometimes healing takes longer if there is a cough that does not stop or if there are other activities that make the injury worse (aggravating factors). While you heal, you will be given medicines to control the pain. You will also be taught deep breathing exercises. Severe injuries may require hospitalization or surgery. Follow these instructions at home: Managing pain,  stiffness, and swelling If directed, apply ice to the injured area. Put ice in a plastic bag. Place a towel between your skin and the bag. Leave the ice on for 20 minutes, 2-3 times a day. Take over-the-counter and prescription medicines only as told by your health care provider. Activity Avoid a lot of activity and any activities or movements that cause pain. Be careful during activities and avoid bumping the injured rib. Slowly increase your activity as told by your health care provider. General instructions Do deep breathing exercises as told by your health care provider. This helps prevent pneumonia, which is a common complication of a broken rib. Your health care provider may instruct you to: Take deep breaths several times a day. Try to cough several times a day, holding a pillow against the injured area. Use a device called incentive spirometer to practice deep breathing several times a day. Drink enough fluid to keep your urine pale yellow. Do not wear a rib belt or binder. These restrict breathing, which can lead to pneumonia. Keep all follow-up visits as told by your health care provider. This is important. Contact a health care provider if: You have a fever. Get   help right away if: You have difficulty breathing or you are short of breath. You develop a cough that does not stop, or you cough up thick or bloody sputum. You have nausea, vomiting, or pain in your abdomen. Your pain gets worse and medicine does not help. Summary A rib fracture is a break or crack in one of the bones of the ribs. A broken or cracked rib is often painful but is not usually serious. Most rib fractures heal on their own over time. Treatment for this condition depends on the severity of the fracture. Avoid a lot of activity and any activities or movements that cause pain. This information is not intended to replace advice given to you by your health care provider. Make sure you discuss any questions  you have with your health care provider. Document Released: 11/04/2005 Document Revised: 02/03/2017 Document Reviewed: 02/03/2017 Elsevier Interactive Patient Education  2019 Elsevier Inc.    Pneumothorax A pneumothorax is commonly called a collapsed lung. It is a condition in which air leaks from a lung and builds up between the thin layer of tissue that covers the lungs (visceral pleura) and the interior wall of the chest cavity (parietal pleura). The air gets trapped outside the lung, between the lung and the chest wall (pleural space). The air takes up space and prevents the lung from fully expanding. This condition sometimes occurs suddenly with no apparent cause. The buildup of air may be small or large. A small pneumothorax may go away on its own. A large pneumothorax will require treatment and hospitalization. What are the causes? This condition may be caused by: Trauma and injury to the chest wall. Surgery and other medical procedures. A complication of an underlying lung problem, especially chronic obstructive pulmonary disease (COPD) or emphysema. Sometimes the cause of this condition is not known. What increases the risk? You are more likely to develop this condition if: You have an underlying lung problem. You smoke. You are 20-40 years old, male, tall, and underweight. You have a personal or family history of pneumothorax. You have an eating disorder (anorexia nervosa). This condition can also happen quickly, even in people with no history of lung problems. What are the signs or symptoms? Sometimes a pneumothorax will have no symptoms. When symptoms are present, they can include: Chest pain. Shortness of breath. Increased rate of breathing. Bluish color to your lips or skin (cyanosis). How is this diagnosed? This condition may be diagnosed by: A medical history and physical exam. A chest X-ray, chest CT scan, or ultrasound. How is this treated? Treatment depends on  how severe your condition is. The goal of treatment is to remove the extra air and allow your lung to expand back to its normal size. For a small pneumothorax: No treatment may be needed. Extra oxygen is sometimes used to make it go away more quickly. For a large pneumothorax or a pneumothorax that is causing symptoms, a procedure is done to drain the air from your lungs. To do this, a health care provider may use: A needle with a syringe. This is used to suck air from a pleural space where no additional leakage is taking place. A chest tube. This is used to suck air where there is ongoing leakage into the pleural space. The chest tube may need to remain in place for several days until the air leak has healed. In more severe cases, surgery may be needed to repair the damage that is causing the leak. If you   have multiple pneumothorax episodes or have an air leak that will not heal, a procedure called a pleurodesis may be done. A medicine is placed in the pleural space to irritate the tissues around the lung so that the lung will stick to the chest wall, seal any leaks, and stop any buildup of air in that space. If you have an underlying lung problem, severe symptoms, or a large pneumothorax you will usually need to stay in the hospital. Follow these instructions at home: Lifestyle Do not use any products that contain nicotine or tobacco, such as cigarettes and e-cigarettes. These are major risk factors in pneumothorax. If you need help quitting, ask your health care provider. Do not lift anything that is heavier than 10 lb (4.5 kg), or the limit that your health care provider tells you, until he or she says that it is safe. Avoid activities that take a lot of effort (strenuous) for as long as told by your health care provider. Return to your normal activities as told by your health care provider. Ask your health care provider what activities are safe for you. Do not fly in an airplane or scuba dive  until your health care provider says it is okay. General instructions Take over-the-counter and prescription medicines only as told by your health care provider. If a cough or pain makes it difficult for you to sleep at night, try sleeping in a semi-upright position in a recliner or by using 2 or 3 pillows. If you had a chest tube and it was removed, ask your health care provider when you can remove the bandage (dressing). While the dressing is in place, do not allow it to get wet. Keep all follow-up visits as told by your health care provider. This is important. Contact a health care provider if: You cough up thick mucus (sputum) that is yellow or green in color. You were treated with a chest tube, and you have redness, increasing pain, or discharge at the site where it was placed. Get help right away if: You have increasing chest pain or shortness of breath. You have a cough that will not go away. You begin coughing up blood. You have pain that is getting worse or is not controlled with medicines. The site where your chest tube was located opens up. You feel air coming out of the site where the chest tube was placed. You have a fever or persistent symptoms for more than 2-3 days. You have a fever and your symptoms suddenly get worse. These symptoms may represent a serious problem that is an emergency. Do not wait to see if the symptoms will go away. Get medical help right away. Call your local emergency services (911 in the [place] Do not drive yourself to the hospital. Summary A pneumothorax, commonly called a collapsed lung, is a condition in which air leaks from a lung and gets trapped between the lung and the chest wall (pleural space). The buildup of air may be small or large. A small pneumothorax may go away on its own. A large pneumothorax will require treatment and hospitalization. Treatment for this condition depends on how severe the pneumothorax is. The goal of treatment is to  remove the extra air and allow the lung to expand back to its normal size. This information is not intended to replace advice given to you by your health care provider. Make sure you discuss any questions you have with your health care provider. Document Released: 11/04/2005 Document Revised: 10/13/2017 Document   Reviewed: 10/13/2017 Elsevier Interactive Patient Education  2019 Elsevier Inc.  
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
## 2021-08-15 ENCOUNTER — Other Ambulatory Visit (HOSPITAL_COMMUNITY): Payer: Self-pay

## 2021-08-15 ENCOUNTER — Inpatient Hospital Stay (HOSPITAL_COMMUNITY): Payer: Self-pay

## 2021-08-15 LAB — CBC
HCT: 45.2 % (ref 39.0–52.0)
Hemoglobin: 16.3 g/dL (ref 13.0–17.0)
MCH: 31.7 pg (ref 26.0–34.0)
MCHC: 36.1 g/dL — ABNORMAL HIGH (ref 30.0–36.0)
MCV: 87.9 fL (ref 80.0–100.0)
Platelets: 210 10*3/uL (ref 150–400)
RBC: 5.14 MIL/uL (ref 4.22–5.81)
RDW: 11.7 % (ref 11.5–15.5)
WBC: 6.1 10*3/uL (ref 4.0–10.5)
nRBC: 0 % (ref 0.0–0.2)

## 2021-08-15 LAB — BASIC METABOLIC PANEL
Anion gap: 10 (ref 5–15)
BUN: 12 mg/dL (ref 6–20)
CO2: 23 mmol/L (ref 22–32)
Calcium: 8.9 mg/dL (ref 8.9–10.3)
Chloride: 100 mmol/L (ref 98–111)
Creatinine, Ser: 0.93 mg/dL (ref 0.61–1.24)
GFR, Estimated: >60 mL/min (ref >60)
Glucose, Bld: 98 mg/dL (ref 70–99)
Potassium: 3.9 mmol/L (ref 3.5–5.1)
Sodium: 133 mmol/L — ABNORMAL LOW (ref 135–145)

## 2021-08-15 MED ORDER — OXYCODONE HCL 10 MG PO TABS
5.0000 mg | ORAL_TABLET | Freq: Four times a day (QID) | ORAL | 0 refills | Status: DC | PRN
  Filled 2021-08-15: qty 30, 8d supply, fill #0

## 2021-08-15 MED ORDER — AMLODIPINE BESYLATE 5 MG PO TABS
5.0000 mg | ORAL_TABLET | Freq: Every day | ORAL | 0 refills | Status: DC
  Filled 2021-08-15: qty 30, 30d supply, fill #0

## 2021-08-15 MED ORDER — LIDOCAINE 5 % EX PTCH
1.0000 | MEDICATED_PATCH | CUTANEOUS | 0 refills | Status: DC
  Filled 2021-08-15: qty 30, 30d supply, fill #0

## 2021-08-15 MED ORDER — POLYETHYLENE GLYCOL 3350 17 G PO PACK: 17.0000 g | PACK | Freq: Every day | ORAL | 0 refills | Status: DC | PRN

## 2021-08-15 MED ORDER — TRAMADOL HCL 50 MG PO TABS
50.0000 mg | ORAL_TABLET | Freq: Four times a day (QID) | ORAL | 0 refills | Status: DC | PRN
  Filled 2021-08-15: qty 30, 7d supply, fill #0

## 2021-08-15 MED ORDER — ACETAMINOPHEN 500 MG PO TABS: 1000.0000 mg | ORAL_TABLET | Freq: Four times a day (QID) | ORAL | 0 refills | Status: DC | PRN

## 2021-08-15 MED ORDER — METHOCARBAMOL 500 MG PO TABS
1000.0000 mg | ORAL_TABLET | Freq: Three times a day (TID) | ORAL | 0 refills | Status: DC | PRN
  Filled 2021-08-15: qty 60, 10d supply, fill #0
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
## 2021-08-15 NOTE — TOC Transition Note (Signed)
Transition of Care [facility]) - CM/SW Discharge Note   Patient Details  Name: Scott Case MRN: [id number] Date of Birth: 05-21-1976  Transition of Care [facility]) CM/SW Contact:  Glennon Mac, RN Phone Number: 08/15/2021, 2:01 PM   Clinical Narrative:    Pt for possible discharge later today pending CXR results and removal of chest tube.  Pt is uninsured, but is eligible for medication assistance through [facility] program. Discharge Rx sent to [facility] pharmacy to be filled using MATCH letter.  Unable to secure PCP follow up within 30 days in [facility] area where patient resides; he is agreeable to PCP follow up at [facility] in [place], and appointment made accordingly.  Pt encouraged to keep appointment for monitoring/regulation of high blood pressure. He verbalizes understanding of this.   Final next level of care: Home/Self Care Barriers to Discharge: Barriers Resolved   Patient Goals and CMS Choice Patient states their goals for this hospitalization and ongoing recovery are:: to go home                            Discharge Plan and Services   Discharge Planning Services: Indigent Health Clinic, [facility] Program, Medication Assistance, Follow-up appt scheduled                                 Social Determinants of Health (SDOH) Interventions     Readmission Risk Interventions Readmission Risk Prevention Plan 08/15/2021 08/14/2021  Post Dischage Appt Complete Complete  Medication Screening Complete Complete  Transportation Screening Complete Complete   Quintella Baton, RN, BSN  Trauma/Neuro ICU Case Manager [phone]
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
## 2021-09-18 ENCOUNTER — Inpatient Hospital Stay (INDEPENDENT_AMBULATORY_CARE_PROVIDER_SITE_OTHER): Payer: Medicaid Other | Admitting: Primary Care

## 2022-09-01 NOTE — Patient Instructions (Signed)

## 2022-09-03 ENCOUNTER — Encounter: Payer: Self-pay | Admitting: Nurse Practitioner

## 2022-09-03 VITALS — BP 118/79 | HR 67 | Temp 98.0°F | Ht 73.0 in | Wt 221.6 lb

## 2022-09-03 DIAGNOSIS — Z0289 Encounter for other administrative examinations: Secondary | ICD-10-CM

## 2022-09-04 NOTE — Progress Notes (Signed)
No encounter, patient no seen

## 2022-09-15 NOTE — Patient Instructions (Signed)

## 2022-09-17 ENCOUNTER — Ambulatory Visit (INDEPENDENT_AMBULATORY_CARE_PROVIDER_SITE_OTHER): Payer: Self-pay | Admitting: Nurse Practitioner

## 2022-09-17 ENCOUNTER — Encounter: Payer: Self-pay | Admitting: Nurse Practitioner

## 2022-09-17 VITALS — BP 125/80 | HR 74 | Temp 97.7°F | Ht 73.0 in | Wt 216.1 lb

## 2022-09-17 DIAGNOSIS — Z7689 Persons encountering health services in other specified circumstances: Secondary | ICD-10-CM

## 2022-09-17 DIAGNOSIS — Z833 Family history of diabetes mellitus: Secondary | ICD-10-CM

## 2022-09-17 DIAGNOSIS — Z87898 Personal history of other specified conditions: Secondary | ICD-10-CM
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
## 2022-09-17 NOTE — Assessment & Plan Note (Signed)
Check A1c today due to past syncopal episode and family history, monitor annually.  Recommend yearly physicals.

## 2022-09-17 NOTE — Assessment & Plan Note (Signed)
In August 2023 due to dehydration and working out in heat.  Recommend ensuring good hydration during hotter months and no driving if dizziness occurs.  Check BMP and TSH today.

## 2022-09-18 LAB — BASIC METABOLIC PANEL
BUN/Creatinine Ratio: 18 (ref 9–20)
BUN: 19 mg/dL (ref 6–24)
CO2: 19 mmol/L — ABNORMAL LOW (ref 20–29)
Calcium: 9.3 mg/dL (ref 8.7–10.2)
Chloride: 103 mmol/L (ref 96–106)
Creatinine, Ser: 1.05 mg/dL (ref 0.76–1.27)
Glucose: 93 mg/dL (ref 70–99)
Potassium: 4.4 mmol/L (ref 3.5–5.2)
Sodium: 139 mmol/L (ref 134–144)
eGFR: 89 mL/min/{1.73_m2} (ref >59)

## 2022-09-18 LAB — TSH: TSH: 0.936 u[IU]/mL (ref 0.450–4.500)

## 2022-09-18 LAB — HEMOGLOBIN A1C
Est. average glucose Bld gHb Est-mCnc: 108 mg/dL
Hgb A1c MFr Bld: 5.4 % (ref 4.8–5.6)

## 2022-09-18 NOTE — Progress Notes (Signed)
Good morning, please let Scott Case know that his labs returned and everything is stable, including sodium level.  Overall good labs.  Have a great day!! Keep being amazing!!  Thank you for allowing me to participate in your care.  I appreciate you. Kindest regards, Caylei Sperry

## 2023-08-20 IMAGING — DX DG CHEST 1V PORT
1 series · 1 of 1 positions shown · non-contrast
Comparison: 08/14/2021

CLINICAL DATA: Chest tube

EXAM:
PORTABLE CHEST 1 VIEW

[chest]
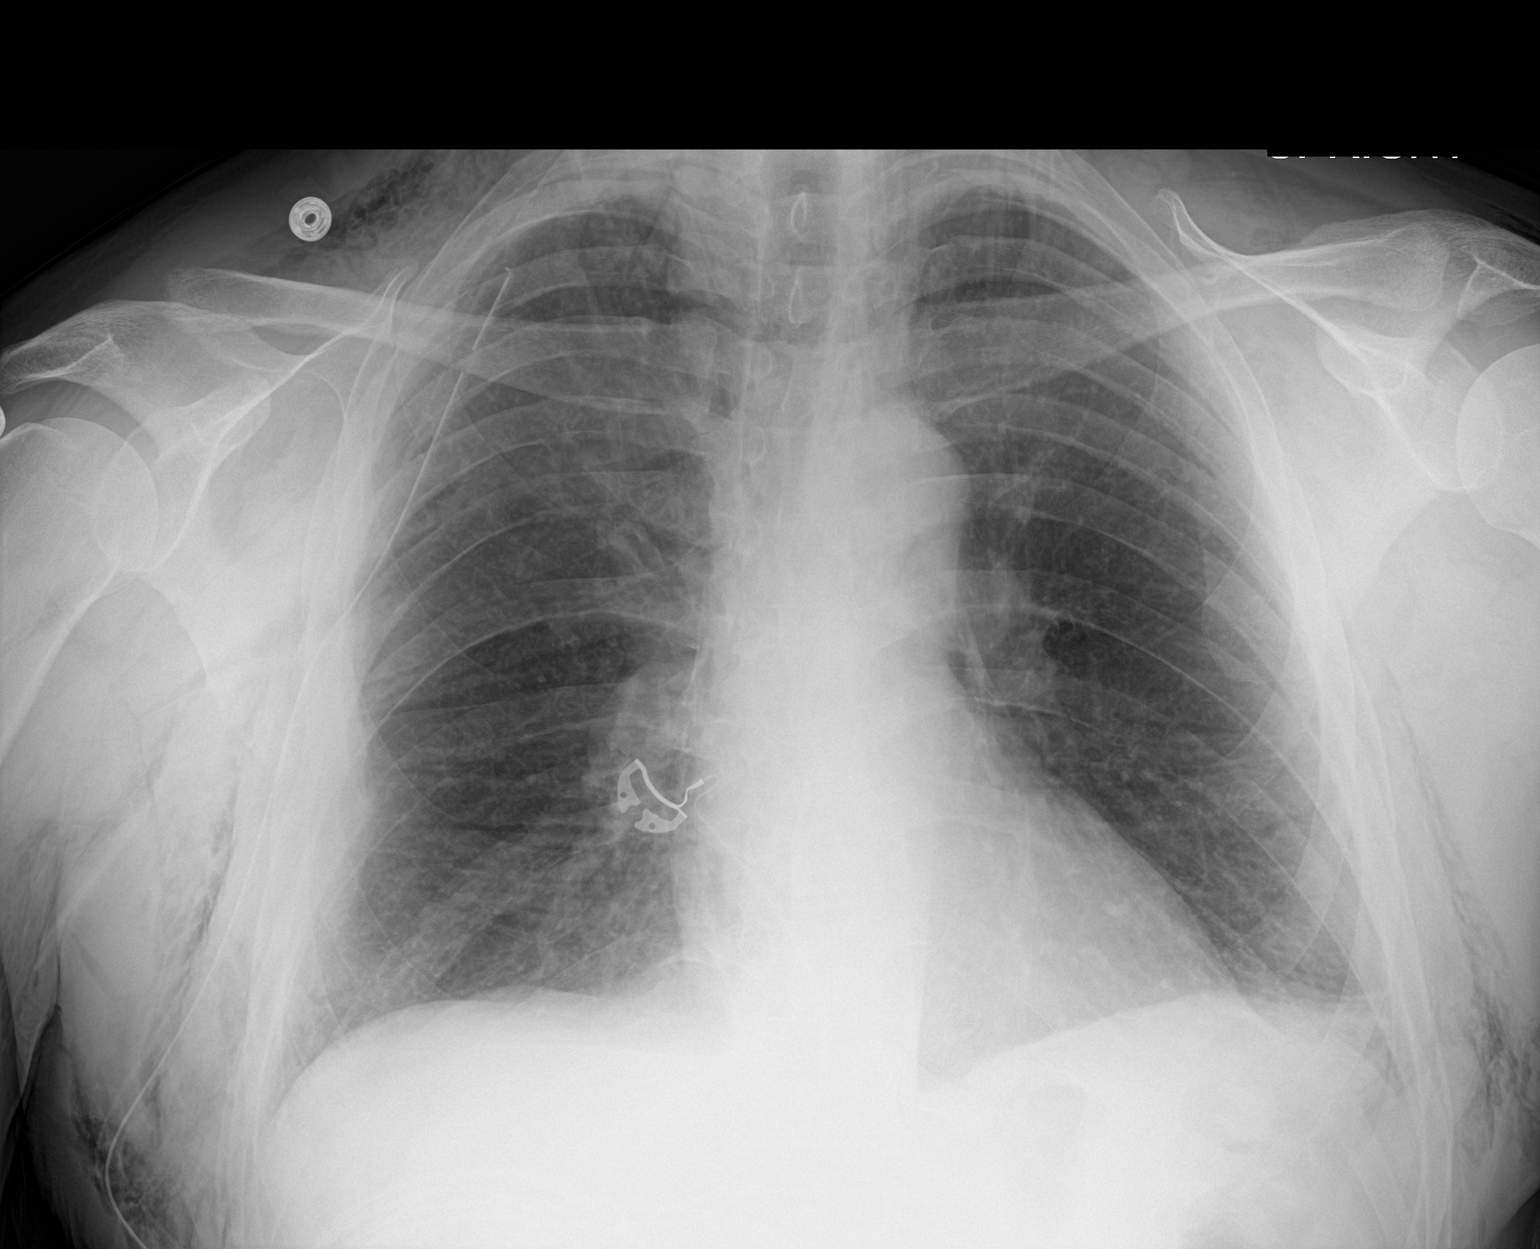

[1 of 1 positions shown; findings below may reference images not displayed]

FINDINGS: Right chest tube remains in place, unchanged. No visible
pneumothorax. Extensive subcutaneous emphysema throughout the chest
wall and lower neck, slightly improved. Pneumomediastinum also
likely slightly improved. Heart is normal size. Left base
atelectasis. Right lung clear.
IMPRESSION: Right chest tube in place without visible pneumothorax. Some
improvement in subcutaneous emphysema and pneumomediastinum.

## 2023-09-19 ENCOUNTER — Encounter: Payer: Self-pay | Admitting: Nurse Practitioner

## 2023-09-19 DIAGNOSIS — Z0000 Encounter for general adult medical examination without abnormal findings: Secondary | ICD-10-CM

## 2023-09-19 DIAGNOSIS — Z13220 Encounter for screening for lipoid disorders: Secondary | ICD-10-CM

## 2023-09-19 DIAGNOSIS — Z833 Family history of diabetes mellitus: Secondary | ICD-10-CM

## 2023-09-19 DIAGNOSIS — Z1159 Encounter for screening for other viral diseases: Secondary | ICD-10-CM

## 2024-07-23 ENCOUNTER — Ambulatory Visit
Admission: EM | Admit: 2024-07-23 | Discharge: 2024-07-23 | Disposition: A | Discharge status: Home / Self Care | Attending: Family Medicine | Admitting: Family Medicine

## 2024-07-23 DIAGNOSIS — T24231A Burn of second degree of right lower leg, initial encounter: Secondary | ICD-10-CM

## 2024-07-23 DIAGNOSIS — L03115 Cellulitis of right lower limb: Secondary | ICD-10-CM | POA: Diagnosis not present

## 2024-07-23 MED ORDER — TRIAMCINOLONE ACETONIDE 0.1 % EX OINT: 1.0000 | TOPICAL_OINTMENT | Freq: Two times a day (BID) | CUTANEOUS | 0 refills | Status: AC

## 2024-07-23 MED ORDER — SILVER SULFADIAZINE 1 % EX CREA: 1.0000 | TOPICAL_CREAM | Freq: Every day | CUTANEOUS | 0 refills | Status: AC

## 2024-07-23 MED ORDER — DOXYCYCLINE HYCLATE 100 MG PO CAPS: 100.0000 mg | ORAL_CAPSULE | Freq: Two times a day (BID) | ORAL | 0 refills | Status: AC
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
